# Patient Record
Sex: Female | Born: 1945 | Race: White | Hispanic: No | Smoking: Never smoker
Health system: Southern US, Community
[De-identification: ages and names within clinical notes are randomized; demographics above are authoritative.]

## PROBLEM LIST (undated history)

## (undated) HISTORY — PX: WISDOM TOOTH EXTRACTION: SHX21

## (undated) HISTORY — PX: FOOT SURGERY: SHX648

---

## 1997-12-07 ENCOUNTER — Other Ambulatory Visit: Admission: RE | Admit: 1997-12-07 | Discharge: 1997-12-07 | Payer: Self-pay | Admitting: Obstetrics and Gynecology

## 1998-12-13 ENCOUNTER — Other Ambulatory Visit: Admission: RE | Admit: 1998-12-13 | Discharge: 1998-12-13 | Payer: Self-pay | Admitting: Obstetrics and Gynecology

## 1999-08-17 ENCOUNTER — Encounter (INDEPENDENT_AMBULATORY_CARE_PROVIDER_SITE_OTHER): Payer: Self-pay | Admitting: Specialist

## 1999-08-17 ENCOUNTER — Other Ambulatory Visit: Admission: RE | Admit: 1999-08-17 | Discharge: 1999-08-17 | Payer: Self-pay | Admitting: Obstetrics and Gynecology

## 1999-12-26 ENCOUNTER — Other Ambulatory Visit: Admission: RE | Admit: 1999-12-26 | Discharge: 1999-12-26 | Payer: Self-pay | Admitting: Obstetrics and Gynecology

## 2001-02-25 ENCOUNTER — Other Ambulatory Visit: Admission: RE | Admit: 2001-02-25 | Discharge: 2001-02-25 | Payer: Self-pay | Admitting: Obstetrics and Gynecology

## 2004-03-10 ENCOUNTER — Other Ambulatory Visit: Admission: RE | Admit: 2004-03-10 | Discharge: 2004-03-10 | Payer: Self-pay | Admitting: Family Medicine

## 2004-03-31 ENCOUNTER — Ambulatory Visit (HOSPITAL_COMMUNITY): Admission: RE | Admit: 2004-03-31 | Discharge: 2004-03-31 | Payer: Self-pay | Admitting: Family Medicine

## 2004-04-17 ENCOUNTER — Encounter: Admission: RE | Admit: 2004-04-17 | Discharge: 2004-04-17 | Payer: Self-pay | Admitting: Family Medicine

## 2008-09-14 ENCOUNTER — Other Ambulatory Visit: Admission: RE | Admit: 2008-09-14 | Discharge: 2008-09-14 | Payer: Self-pay | Admitting: Family Medicine

## 2011-05-10 ENCOUNTER — Other Ambulatory Visit: Payer: Self-pay | Admitting: Family Medicine

## 2011-05-10 ENCOUNTER — Other Ambulatory Visit (HOSPITAL_COMMUNITY)
Admission: RE | Admit: 2011-05-10 | Discharge: 2011-05-10 | Disposition: A | Discharge status: Home / Self Care | Payer: Medicare Other | Source: Ambulatory Visit | Attending: Family Medicine | Admitting: Family Medicine

## 2011-05-10 DIAGNOSIS — Z124 Encounter for screening for malignant neoplasm of cervix: Secondary | ICD-10-CM | POA: Insufficient documentation

## 2011-12-03 ENCOUNTER — Other Ambulatory Visit: Payer: Self-pay | Admitting: Dermatology

## 2014-01-11 ENCOUNTER — Other Ambulatory Visit: Payer: Self-pay | Admitting: Family Medicine

## 2014-01-11 ENCOUNTER — Other Ambulatory Visit (HOSPITAL_COMMUNITY)
Admission: RE | Admit: 2014-01-11 | Discharge: 2014-01-11 | Disposition: A | Discharge status: Home / Self Care | Payer: Medicare HMO | Source: Ambulatory Visit | Attending: Family Medicine | Admitting: Family Medicine

## 2014-01-11 DIAGNOSIS — Z124 Encounter for screening for malignant neoplasm of cervix: Secondary | ICD-10-CM | POA: Diagnosis present

## 2014-01-13 LAB — CYTOLOGY - PAP

## 2016-07-27 DIAGNOSIS — Z6824 Body mass index (BMI) 24.0-24.9, adult: Secondary | ICD-10-CM | POA: Diagnosis not present

## 2016-07-27 DIAGNOSIS — Z0000 Encounter for general adult medical examination without abnormal findings: Secondary | ICD-10-CM | POA: Diagnosis not present

## 2016-07-27 DIAGNOSIS — M722 Plantar fascial fibromatosis: Secondary | ICD-10-CM | POA: Diagnosis not present

## 2017-01-24 DIAGNOSIS — H353111 Nonexudative age-related macular degeneration, right eye, early dry stage: Secondary | ICD-10-CM | POA: Diagnosis not present

## 2017-01-24 DIAGNOSIS — H2513 Age-related nuclear cataract, bilateral: Secondary | ICD-10-CM | POA: Diagnosis not present

## 2017-01-24 DIAGNOSIS — H04123 Dry eye syndrome of bilateral lacrimal glands: Secondary | ICD-10-CM | POA: Diagnosis not present

## 2017-01-24 DIAGNOSIS — H10503 Unspecified blepharoconjunctivitis, bilateral: Secondary | ICD-10-CM | POA: Diagnosis not present

## 2017-02-07 DIAGNOSIS — J019 Acute sinusitis, unspecified: Secondary | ICD-10-CM | POA: Diagnosis not present

## 2017-07-11 DIAGNOSIS — Z1389 Encounter for screening for other disorder: Secondary | ICD-10-CM | POA: Diagnosis not present

## 2017-07-11 DIAGNOSIS — Z0000 Encounter for general adult medical examination without abnormal findings: Secondary | ICD-10-CM | POA: Diagnosis not present

## 2017-07-11 DIAGNOSIS — Z1211 Encounter for screening for malignant neoplasm of colon: Secondary | ICD-10-CM | POA: Diagnosis not present

## 2017-07-11 DIAGNOSIS — Z23 Encounter for immunization: Secondary | ICD-10-CM | POA: Diagnosis not present

## 2017-07-11 DIAGNOSIS — E785 Hyperlipidemia, unspecified: Secondary | ICD-10-CM | POA: Diagnosis not present

## 2017-07-11 DIAGNOSIS — M810 Age-related osteoporosis without current pathological fracture: Secondary | ICD-10-CM | POA: Diagnosis not present

## 2017-07-15 DIAGNOSIS — R040 Epistaxis: Secondary | ICD-10-CM | POA: Diagnosis not present

## 2017-09-17 DIAGNOSIS — Z1211 Encounter for screening for malignant neoplasm of colon: Secondary | ICD-10-CM | POA: Diagnosis not present

## 2017-11-26 DIAGNOSIS — E785 Hyperlipidemia, unspecified: Secondary | ICD-10-CM | POA: Diagnosis not present

## 2018-04-15 DIAGNOSIS — I8311 Varicose veins of right lower extremity with inflammation: Secondary | ICD-10-CM | POA: Diagnosis not present

## 2018-04-15 DIAGNOSIS — I8312 Varicose veins of left lower extremity with inflammation: Secondary | ICD-10-CM | POA: Diagnosis not present

## 2018-05-28 DIAGNOSIS — H6983 Other specified disorders of Eustachian tube, bilateral: Secondary | ICD-10-CM | POA: Diagnosis not present

## 2018-05-28 DIAGNOSIS — J0140 Acute pansinusitis, unspecified: Secondary | ICD-10-CM | POA: Diagnosis not present

## 2018-05-28 DIAGNOSIS — R0982 Postnasal drip: Secondary | ICD-10-CM | POA: Diagnosis not present

## 2018-05-28 DIAGNOSIS — J029 Acute pharyngitis, unspecified: Secondary | ICD-10-CM | POA: Diagnosis not present

## 2018-06-10 DIAGNOSIS — G2581 Restless legs syndrome: Secondary | ICD-10-CM | POA: Diagnosis not present

## 2018-06-10 DIAGNOSIS — M79604 Pain in right leg: Secondary | ICD-10-CM | POA: Diagnosis not present

## 2018-06-10 DIAGNOSIS — M79605 Pain in left leg: Secondary | ICD-10-CM | POA: Diagnosis not present

## 2018-12-23 ENCOUNTER — Other Ambulatory Visit: Payer: Self-pay | Admitting: Family Medicine

## 2018-12-23 DIAGNOSIS — Z139 Encounter for screening, unspecified: Secondary | ICD-10-CM

## 2019-04-09 DIAGNOSIS — Z1231 Encounter for screening mammogram for malignant neoplasm of breast: Secondary | ICD-10-CM | POA: Diagnosis not present

## 2019-04-09 DIAGNOSIS — L821 Other seborrheic keratosis: Secondary | ICD-10-CM | POA: Diagnosis not present

## 2019-04-09 DIAGNOSIS — R238 Other skin changes: Secondary | ICD-10-CM | POA: Diagnosis not present

## 2019-05-19 DIAGNOSIS — Z1231 Encounter for screening mammogram for malignant neoplasm of breast: Secondary | ICD-10-CM | POA: Diagnosis not present

## 2019-12-23 DIAGNOSIS — R42 Dizziness and giddiness: Secondary | ICD-10-CM | POA: Diagnosis not present

## 2019-12-23 DIAGNOSIS — R079 Chest pain, unspecified: Secondary | ICD-10-CM | POA: Diagnosis not present

## 2019-12-23 DIAGNOSIS — R5383 Other fatigue: Secondary | ICD-10-CM | POA: Diagnosis not present

## 2019-12-23 DIAGNOSIS — I959 Hypotension, unspecified: Secondary | ICD-10-CM | POA: Diagnosis not present

## 2019-12-23 DIAGNOSIS — M810 Age-related osteoporosis without current pathological fracture: Secondary | ICD-10-CM | POA: Diagnosis not present

## 2019-12-24 ENCOUNTER — Other Ambulatory Visit: Payer: Self-pay | Admitting: Family Medicine

## 2019-12-24 ENCOUNTER — Ambulatory Visit
Admission: RE | Admit: 2019-12-24 | Discharge: 2019-12-24 | Disposition: A | Discharge status: Home / Self Care | Payer: PRIVATE HEALTH INSURANCE | Source: Ambulatory Visit | Attending: Family Medicine | Admitting: Family Medicine

## 2019-12-24 DIAGNOSIS — R079 Chest pain, unspecified: Secondary | ICD-10-CM | POA: Diagnosis not present

## 2019-12-24 DIAGNOSIS — R0602 Shortness of breath: Secondary | ICD-10-CM | POA: Diagnosis not present

## 2019-12-25 ENCOUNTER — Telehealth: Payer: Self-pay

## 2019-12-25 DIAGNOSIS — R059 Cough, unspecified: Secondary | ICD-10-CM | POA: Diagnosis not present

## 2019-12-25 DIAGNOSIS — Z03818 Encounter for observation for suspected exposure to other biological agents ruled out: Secondary | ICD-10-CM | POA: Diagnosis not present

## 2019-12-25 NOTE — Telephone Encounter (Signed)
NOTES ON FILE FROM [facility] [phone], SENT REFERRAL TO SCHEDULING 

## 2019-12-27 ENCOUNTER — Telehealth: Payer: Self-pay | Admitting: Infectious Diseases

## 2019-12-27 NOTE — Telephone Encounter (Signed)
Called to Discuss with patient about Covid symptoms and the use of the monoclonal antibody infusion for those with mild to moderate Covid symptoms and at a high risk of hospitalization.     Pt appears to qualify for this infusion due to co-morbid conditions and/or a member of an at-risk group in accordance with the FDA Emergency Use Authorization.    Sx onset with cough before Thanksgiving that she thought aspirated food.She also had some significant and out of character fatigue at this time that has not gotten any better. She also has had persistent cough and now some diarrhea that started last week.   Unfortunately she is day 10 today with no infusion appointments available. No symptoms to suggest ER visit but I will reach out to post covid clinic to help her with inperson assessment of fatigue and cough.   She is holding foods and fluids down well and not as dizzy now (her BP was very low at recent visit in the office 80s systolic). I also suggested to add some salt (bowl of soup a day, for example) to help with fluid retention as well.   Will help her with scheduling    Rexene Alberts, MSN, NP-C [facility]  [email or web address] Pager: [phone] Office: [phone] [facility] Main Line: [phone]

## 2019-12-28 ENCOUNTER — Other Ambulatory Visit (HOSPITAL_COMMUNITY): Payer: Self-pay

## 2019-12-28 ENCOUNTER — Telehealth (INDEPENDENT_AMBULATORY_CARE_PROVIDER_SITE_OTHER): Payer: Medicare HMO | Admitting: Nurse Practitioner

## 2019-12-28 DIAGNOSIS — R059 Cough, unspecified: Secondary | ICD-10-CM | POA: Diagnosis not present

## 2019-12-28 DIAGNOSIS — U071 COVID-19: Secondary | ICD-10-CM | POA: Insufficient documentation

## 2019-12-28 MED ORDER — PREDNISONE 20 MG PO TABS: 20.0000 mg | ORAL_TABLET | Freq: Every day | ORAL | 0 refills | Status: AC

## 2019-12-28 NOTE — Patient Instructions (Signed)
Covid 19 Cough:   Stay well hydrated  Eat six small meals per day  Stay active  Deep breathing exercises  May take tylenol for fever or pain  May take delsym twice daily  Will order prednisone   Follow up:  Follow up if needed

## 2019-12-28 NOTE — Telephone Encounter (Signed)
Destiny Green,  Could you please call this patient to schedule an appointment? Thanks.

## 2019-12-28 NOTE — Progress Notes (Signed)
Virtual Visit via Telephone Note  I connected with Tahiry Payano on 12/28/19 at  2:00 PM EST by telephone and verified that I am speaking with the correct person using two identifiers.  Location:  Patient: home Provider: office   I discussed the limitations, risks, security and privacy concerns of performing an evaluation and management service by telephone and the availability of in person appointments. I also discussed with the patient that there may be a patient responsible charge related to this service. The patient expressed understanding and agreed to proceed.   History of Present Illness:  74 year old female with no significant health history.  Patient was recently diagnosed with Covid but states that her symptoms started before Thanksgiving.  She was out of the window to receive monoclonal antibody infusion.  Patient was scheduled today for an in office visit but states that she was too weak to come to the office and this visit was changed to a televisit.  Patient complains of ongoing weakness, fatigue, and cough.  Patient states that she has had a poor appetite.  She is trying to stay hydrated.  She also complains that her blood pressure has been lower than normal.  We discussed the importance of eating 6 small meals throughout the day and staying well-hydrated.  We also discussed the importance of deep breathing exercise and staying active.  Patient denies any recent fever or significant shortness of breath.  She did have a recent chest x-ray which did not show any pneumonia.  The chest x-ray was clear.  Patient states that she has not taken any over-the-counter medications to try to help the cough.  Denies f/c/s, n/v/d, hemoptysis, PND, chest pain or edema.      Observations/Objective:  Patient is alert and oriented during tele-visit today   Assessment and Plan:  Covid 19 Cough:   Stay well hydrated  Eat six small meals per day  Stay active  Deep breathing  exercises  May take tylenol for fever or pain  May take delsym twice daily  Will order prednisone   Follow Up Instructions:  Follow up if needed     I discussed the assessment and treatment plan with the patient. The patient was provided an opportunity to ask questions and all were answered. The patient agreed with the plan and demonstrated an understanding of the instructions.   The patient was advised to call back or seek an in-person evaluation if the symptoms worsen or if the condition fails to improve as anticipated.  I provided 22 minutes of non-face-to-face time during this encounter.   Ivonne Andrew, NP

## 2020-02-03 ENCOUNTER — Encounter: Payer: Self-pay | Admitting: General Practice

## 2020-08-09 DIAGNOSIS — Z7722 Contact with and (suspected) exposure to environmental tobacco smoke (acute) (chronic): Secondary | ICD-10-CM | POA: Diagnosis not present

## 2020-08-09 DIAGNOSIS — H3530 Unspecified macular degeneration: Secondary | ICD-10-CM | POA: Diagnosis not present

## 2020-08-09 DIAGNOSIS — R030 Elevated blood-pressure reading, without diagnosis of hypertension: Secondary | ICD-10-CM | POA: Diagnosis not present

## 2020-08-09 DIAGNOSIS — E785 Hyperlipidemia, unspecified: Secondary | ICD-10-CM | POA: Diagnosis not present

## 2020-09-08 DIAGNOSIS — H0102A Squamous blepharitis right eye, upper and lower eyelids: Secondary | ICD-10-CM | POA: Diagnosis not present

## 2020-09-08 DIAGNOSIS — H04123 Dry eye syndrome of bilateral lacrimal glands: Secondary | ICD-10-CM | POA: Diagnosis not present

## 2020-09-08 DIAGNOSIS — H353111 Nonexudative age-related macular degeneration, right eye, early dry stage: Secondary | ICD-10-CM | POA: Diagnosis not present

## 2020-09-08 DIAGNOSIS — H2513 Age-related nuclear cataract, bilateral: Secondary | ICD-10-CM | POA: Diagnosis not present

## 2020-09-30 ENCOUNTER — Ambulatory Visit: Payer: Medicare HMO | Admitting: Family Medicine

## 2020-11-30 ENCOUNTER — Other Ambulatory Visit: Payer: Self-pay

## 2020-12-01 ENCOUNTER — Ambulatory Visit (INDEPENDENT_AMBULATORY_CARE_PROVIDER_SITE_OTHER): Payer: Medicare HMO | Admitting: Family Medicine

## 2020-12-01 ENCOUNTER — Encounter: Payer: Self-pay | Admitting: Family Medicine

## 2020-12-01 VITALS — BP 116/72 | HR 59 | Temp 97.8°F | Ht 63.0 in | Wt 148.8 lb

## 2020-12-01 DIAGNOSIS — S86111A Strain of other muscle(s) and tendon(s) of posterior muscle group at lower leg level, right leg, initial encounter: Secondary | ICD-10-CM

## 2020-12-01 DIAGNOSIS — Z0000 Encounter for general adult medical examination without abnormal findings: Secondary | ICD-10-CM | POA: Diagnosis not present

## 2020-12-01 NOTE — Progress Notes (Signed)
New Patient Office Visit  Subjective:  Patient ID: Destiny Green, female    DOB: 1945-01-26  Age: 75 y.o. MRN: [id number]  CC:  Chief Complaint  Patient presents with   Establish Care    NP/establish care concerns about pain in right leg come and go.     HPI Destiny Green presents for for health check and evaluation of pain in her right medial leg.  It started 7 months ago after she was [place] and had to walk up and down stairs.  If anything it is gotten a little worse.  There was no specific injury it is just been sore since that time.  She would like to transfer her care from Dr. Cliffton Asters.  It has been over 15 years since she had a colonoscopy.  He has no symptoms referable to the colon or intestinal tract.  She does not believe in vaccines.  She did have a mammogram last year and does not intend on having another.  It has been over 15 years since she had a colonoscopy.  History reviewed. No pertinent past medical history.    Family History  Adopted: Yes  Problem Relation Age of Onset   Heart disease Mother     Social History   Socioeconomic History   Marital status: Widowed    Spouse name: Not on file   Number of children: Not on file   Years of education: Not on file   Highest education level: Not on file  Occupational History   Not on file  Tobacco Use   Smoking status: Never   Smokeless tobacco: Never  Vaping Use   Vaping Use: Never used  Substance and Sexual Activity   Alcohol use: Never   Drug use: Never   Sexual activity: Not Currently  Other Topics Concern   Not on file  Social History Narrative   Not on file   Social Determinants of Health   Financial Resource Strain: Not on file  Food Insecurity: Not on file  Transportation Needs: Not on file  Physical Activity: Not on file  Stress: Not on file  Social Connections: Not on file  Intimate Partner Violence: Not on file    ROS Review of Systems  Constitutional:  Negative for  chills, diaphoresis, fatigue, fever and unexpected weight change.  HENT: Negative.    Eyes:  Negative for photophobia and visual disturbance.  Respiratory: Negative.    Cardiovascular: Negative.   Gastrointestinal: Negative.  Negative for abdominal pain, anal bleeding and blood in stool.  Endocrine: Negative for polyphagia and polyuria.  Genitourinary:  Negative for difficulty urinating and hematuria.  Musculoskeletal:  Positive for gait problem and myalgias.  Neurological:  Negative for facial asymmetry, speech difficulty and weakness.  Depression screen [facility] 2/9 12/01/2020 12/01/2020 12/28/2019  Decreased Interest 0 0 1  Down, Depressed, Hopeless 0 0 1  PHQ - 2 Score 0 0 2  Altered sleeping 2 - 2  Tired, decreased energy 2 - 3  Change in appetite 3 - 3  Feeling bad or failure about yourself  0 - 0  Trouble concentrating 0 - 0  Moving slowly or fidgety/restless 0 - 0  Suicidal thoughts 0 - 0  PHQ-9 Score 7 - 10  Difficult doing work/chores Not difficult at all - -     Objective:   Today's Vitals: BP 116/72 (BP Location: Right Arm, Patient Position: Sitting, Cuff Size: Normal)   Pulse (!) 59   Temp 97.8 F (  36.6 C) (Temporal)   Ht 5\' 3"  (1.6 m)   Wt 148 lb 12.8 oz (67.5 kg)   SpO2 99%   BMI 26.36 kg/m   Physical Exam Vitals and nursing note reviewed.  Constitutional:      General: She is not in acute distress.    Appearance: Normal appearance. She is not ill-appearing, toxic-appearing or diaphoretic.  HENT:     Head: Normocephalic and atraumatic.     Right Ear: Tympanic membrane, ear canal and external ear normal.     Left Ear: Tympanic membrane, ear canal and external ear normal.     Mouth/Throat:     Mouth: Mucous membranes are moist.     Pharynx: Oropharynx is clear. No oropharyngeal exudate or posterior oropharyngeal erythema.  Eyes:     General: No scleral icterus.       Right eye: No discharge.        Left eye: No discharge.     Extraocular Movements:  Extraocular movements intact.     Conjunctiva/sclera: Conjunctivae normal.     Pupils: Pupils are equal, round, and reactive to light.  Neck:     Vascular: No carotid bruit.  Cardiovascular:     Rate and Rhythm: Normal rate and regular rhythm.  Pulmonary:     Effort: Pulmonary effort is normal.     Breath sounds: Normal breath sounds.  Abdominal:     General: Bowel sounds are normal.  Musculoskeletal:     Cervical back: No rigidity or tenderness.     Right lower leg: No edema.     Left lower leg: No swelling, deformity, lacerations, tenderness or bony tenderness. No edema.       Legs:  Lymphadenopathy:     Cervical: No cervical adenopathy.  Skin:    General: Skin is warm and dry.  Neurological:     Mental Status: She is alert and oriented to person, place, and time.     Cranial Nerves: No dysarthria or facial asymmetry.     Motor: No weakness or atrophy.  Psychiatric:        Mood and Affect: Mood normal.        Behavior: Behavior normal.    Assessment & Plan:   Problem List Items Addressed This Visit   None Visit Diagnoses     Healthcare maintenance    -  Primary   Relevant Orders   CBC   Comprehensive metabolic panel   LDL cholesterol, direct   Lipid panel   Urinalysis, Routine w reflex microscopic   DG Bone Density   Ambulatory referral to Gastroenterology   Strain of right gastrocnemius muscle, initial encounter       Relevant Orders   Ambulatory referral to Sports Medicine       No outpatient encounter medications on file as of 12/01/2020.   No facility-administered encounter medications on file as of 12/01/2020.    Follow-up: Return in about 6 months (around 05/31/2021), or if symptoms worsen or fail to improve.  Information was given on health maintenance and disease prevention as well as immunizations recommended for those over 65.  Patient informed me that she may not go for her colonoscopy.  She is refusing vaccinations because she does not believe  in them.  Over 30 minutes was spent with this patient between the interview, her exam and reviewing the record. Libby Maw, MD

## 2020-12-02 ENCOUNTER — Telehealth (HOSPITAL_BASED_OUTPATIENT_CLINIC_OR_DEPARTMENT_OTHER): Payer: Self-pay

## 2020-12-02 LAB — COMPREHENSIVE METABOLIC PANEL
ALT: 22 U/L (ref 0–35)
AST: 23 U/L (ref 0–37)
Albumin: 4.5 g/dL (ref 3.5–5.2)
Alkaline Phosphatase: 89 U/L (ref 39–117)
BUN: 18 mg/dL (ref 6–23)
CO2: 26 mEq/L (ref 19–32)
Calcium: 9.5 mg/dL (ref 8.4–10.5)
Chloride: 106 mEq/L (ref 96–112)
Creatinine, Ser: 0.83 mg/dL (ref 0.40–1.20)
GFR: 68.7 mL/min (ref >60.00)
Glucose, Bld: 99 mg/dL (ref 70–99)
Potassium: 4 mEq/L (ref 3.5–5.1)
Sodium: 142 mEq/L (ref 135–145)
Total Bilirubin: 0.5 mg/dL (ref 0.2–1.2)
Total Protein: 6.7 g/dL (ref 6.0–8.3)

## 2020-12-02 LAB — URINALYSIS, ROUTINE W REFLEX MICROSCOPIC
Bilirubin Urine: NEGATIVE
Ketones, ur: NEGATIVE
Nitrite: NEGATIVE
Specific Gravity, Urine: 1.025 (ref 1.000–1.030)
Total Protein, Urine: NEGATIVE
Urine Glucose: NEGATIVE
Urobilinogen, UA: 0.2 (ref 0.0–1.0)
pH: 6 (ref 5.0–8.0)

## 2020-12-02 LAB — LIPID PANEL
Cholesterol: 248 mg/dL — ABNORMAL HIGH (ref 0–200)
HDL: 46.2 mg/dL (ref >39.00)
NonHDL: 202.16
Total CHOL/HDL Ratio: 5
Triglycerides: 208 mg/dL — ABNORMAL HIGH (ref 0.0–149.0)
VLDL: 41.6 mg/dL — ABNORMAL HIGH (ref 0.0–40.0)

## 2020-12-02 LAB — CBC
HCT: 40.5 % (ref 36.0–46.0)
Hemoglobin: 13.5 g/dL (ref 12.0–15.0)
MCHC: 33.3 g/dL (ref 30.0–36.0)
MCV: 98.1 fl (ref 78.0–100.0)
Platelets: 238 10*3/uL (ref 150.0–400.0)
RBC: 4.13 Mil/uL (ref 3.87–5.11)
RDW: 12.8 % (ref 11.5–15.5)
WBC: 6.4 10*3/uL (ref 4.0–10.5)

## 2020-12-02 LAB — LDL CHOLESTEROL, DIRECT: Direct LDL: 147 mg/dL

## 2020-12-07 ENCOUNTER — Ambulatory Visit: Payer: Medicare HMO | Admitting: Family Medicine

## 2020-12-07 ENCOUNTER — Ambulatory Visit: Payer: Self-pay

## 2020-12-07 ENCOUNTER — Encounter: Payer: Self-pay | Admitting: Family Medicine

## 2020-12-07 VITALS — BP 138/76 | Ht 63.75 in | Wt 146.0 lb

## 2020-12-07 DIAGNOSIS — M7660 Achilles tendinitis, unspecified leg: Secondary | ICD-10-CM

## 2020-12-07 DIAGNOSIS — M25871 Other specified joint disorders, right ankle and foot: Secondary | ICD-10-CM

## 2020-12-07 MED ORDER — PREDNISONE 5 MG PO TABS: ORAL_TABLET | ORAL | 0 refills | Status: DC

## 2020-12-07 NOTE — Progress Notes (Signed)
  Destiny Green - 75 y.o. female MRN [id number]  Date of birth: May 10, 1945  SUBJECTIVE:  Including CC & ROS.  No chief complaint on file.   Destiny Green is a 75 y.o. female that is presenting with right posterior ankle pain.  Pain has been ongoing for about 7 months.  It is worse with bending down in certain positions.  Seems to be localized to the Achilles area.  She remembers going up and down the stairs that initiated the event.  Has not tried anything for it at this time.    Review of Systems See HPI   HISTORY: Past Medical, Surgical, Social, and Family History Reviewed & Updated per EMR.   Pertinent Historical Findings include:  History reviewed. No pertinent past medical history.  Past Surgical History:  Procedure Laterality Date   FOOT SURGERY     WISDOM TOOTH EXTRACTION      Family History  Adopted: Yes  Problem Relation Age of Onset   Heart disease Mother     Social History   Socioeconomic History   Marital status: Widowed    Spouse name: Not on file   Number of children: Not on file   Years of education: Not on file   Highest education level: Not on file  Occupational History   Not on file  Tobacco Use   Smoking status: Never   Smokeless tobacco: Never  Vaping Use   Vaping Use: Never used  Substance and Sexual Activity   Alcohol use: Never   Drug use: Never   Sexual activity: Not Currently  Other Topics Concern   Not on file  Social History Narrative   Not on file   Social Determinants of Health   Financial Resource Strain: Not on file  Food Insecurity: Not on file  Transportation Needs: Not on file  Physical Activity: Not on file  Stress: Not on file  Social Connections: Not on file  Intimate Partner Violence: Not on file     PHYSICAL EXAM:  VS: BP 138/76 (BP Location: Left Arm, Patient Position: Sitting)   Ht 5' 3.75" (1.619 m)   Wt 146 lb (66.2 kg)   BMI 25.26 kg/m  Physical Exam Gen: NAD, alert, cooperative with exam,  well-appearing   Limited ultrasound: Right lower leg:  Normal-appearing Achilles tendon at the mid belly and insertion. Hypoechoic change in the posterior fat pad which could represent impingement. Normal-appearing musculotendinous junction. No retrocalcaneal bursitis  Summary: Findings consistent with posterior impingement  Ultrasound and interpretation by Clare Gandy, MD     ASSESSMENT & PLAN:   Ankle impingement syndrome, right Acutely worsening.  Pain is been ongoing for several months.  Seems more consistent with impingement as opposed to Achilles.  Possible for radicular pain as well. -Counseled on home exercise therapy and supportive care. -Prednisone. -Heel lift. -Could consider injection or physical therapy.

## 2020-12-07 NOTE — Patient Instructions (Signed)
Nice to meet you Please try the medicine  Please consider compression   Please try the exercises  Please try the heel lifts  Please send me a message in MyChart with any questions or updates.  Please see me back in 4 weeks.   --Dr. Jordan Likes

## 2020-12-07 NOTE — Assessment & Plan Note (Signed)
Acutely worsening.  Pain is been ongoing for several months.  Seems more consistent with impingement as opposed to Achilles.  Possible for radicular pain as well. -Counseled on home exercise therapy and supportive care. -Prednisone. -Heel lift. -Could consider injection or physical therapy.

## 2020-12-08 ENCOUNTER — Other Ambulatory Visit: Payer: Self-pay

## 2020-12-08 ENCOUNTER — Other Ambulatory Visit (HOSPITAL_BASED_OUTPATIENT_CLINIC_OR_DEPARTMENT_OTHER): Payer: Self-pay | Admitting: Family Medicine

## 2020-12-08 ENCOUNTER — Ambulatory Visit (HOSPITAL_BASED_OUTPATIENT_CLINIC_OR_DEPARTMENT_OTHER)
Admission: RE | Admit: 2020-12-08 | Discharge: 2020-12-08 | Disposition: A | Discharge status: Home / Self Care | Payer: Medicare HMO | Source: Ambulatory Visit | Attending: Family Medicine | Admitting: Family Medicine

## 2020-12-08 DIAGNOSIS — Z0000 Encounter for general adult medical examination without abnormal findings: Secondary | ICD-10-CM

## 2020-12-08 DIAGNOSIS — Z780 Asymptomatic menopausal state: Secondary | ICD-10-CM | POA: Insufficient documentation

## 2020-12-08 DIAGNOSIS — Z13820 Encounter for screening for osteoporosis: Secondary | ICD-10-CM | POA: Diagnosis not present

## 2020-12-08 DIAGNOSIS — M810 Age-related osteoporosis without current pathological fracture: Secondary | ICD-10-CM | POA: Diagnosis not present

## 2020-12-08 DIAGNOSIS — M8588 Other specified disorders of bone density and structure, other site: Secondary | ICD-10-CM | POA: Diagnosis not present

## 2021-01-16 IMAGING — DX DG CHEST 2V
2 series · 2 of 2 positions shown · non-contrast
Comparison: None.

CLINICAL DATA: Chest pain and shortness of breath.

EXAM:
CHEST - 2 VIEW

[dg chest 2 view (1 of 2)]
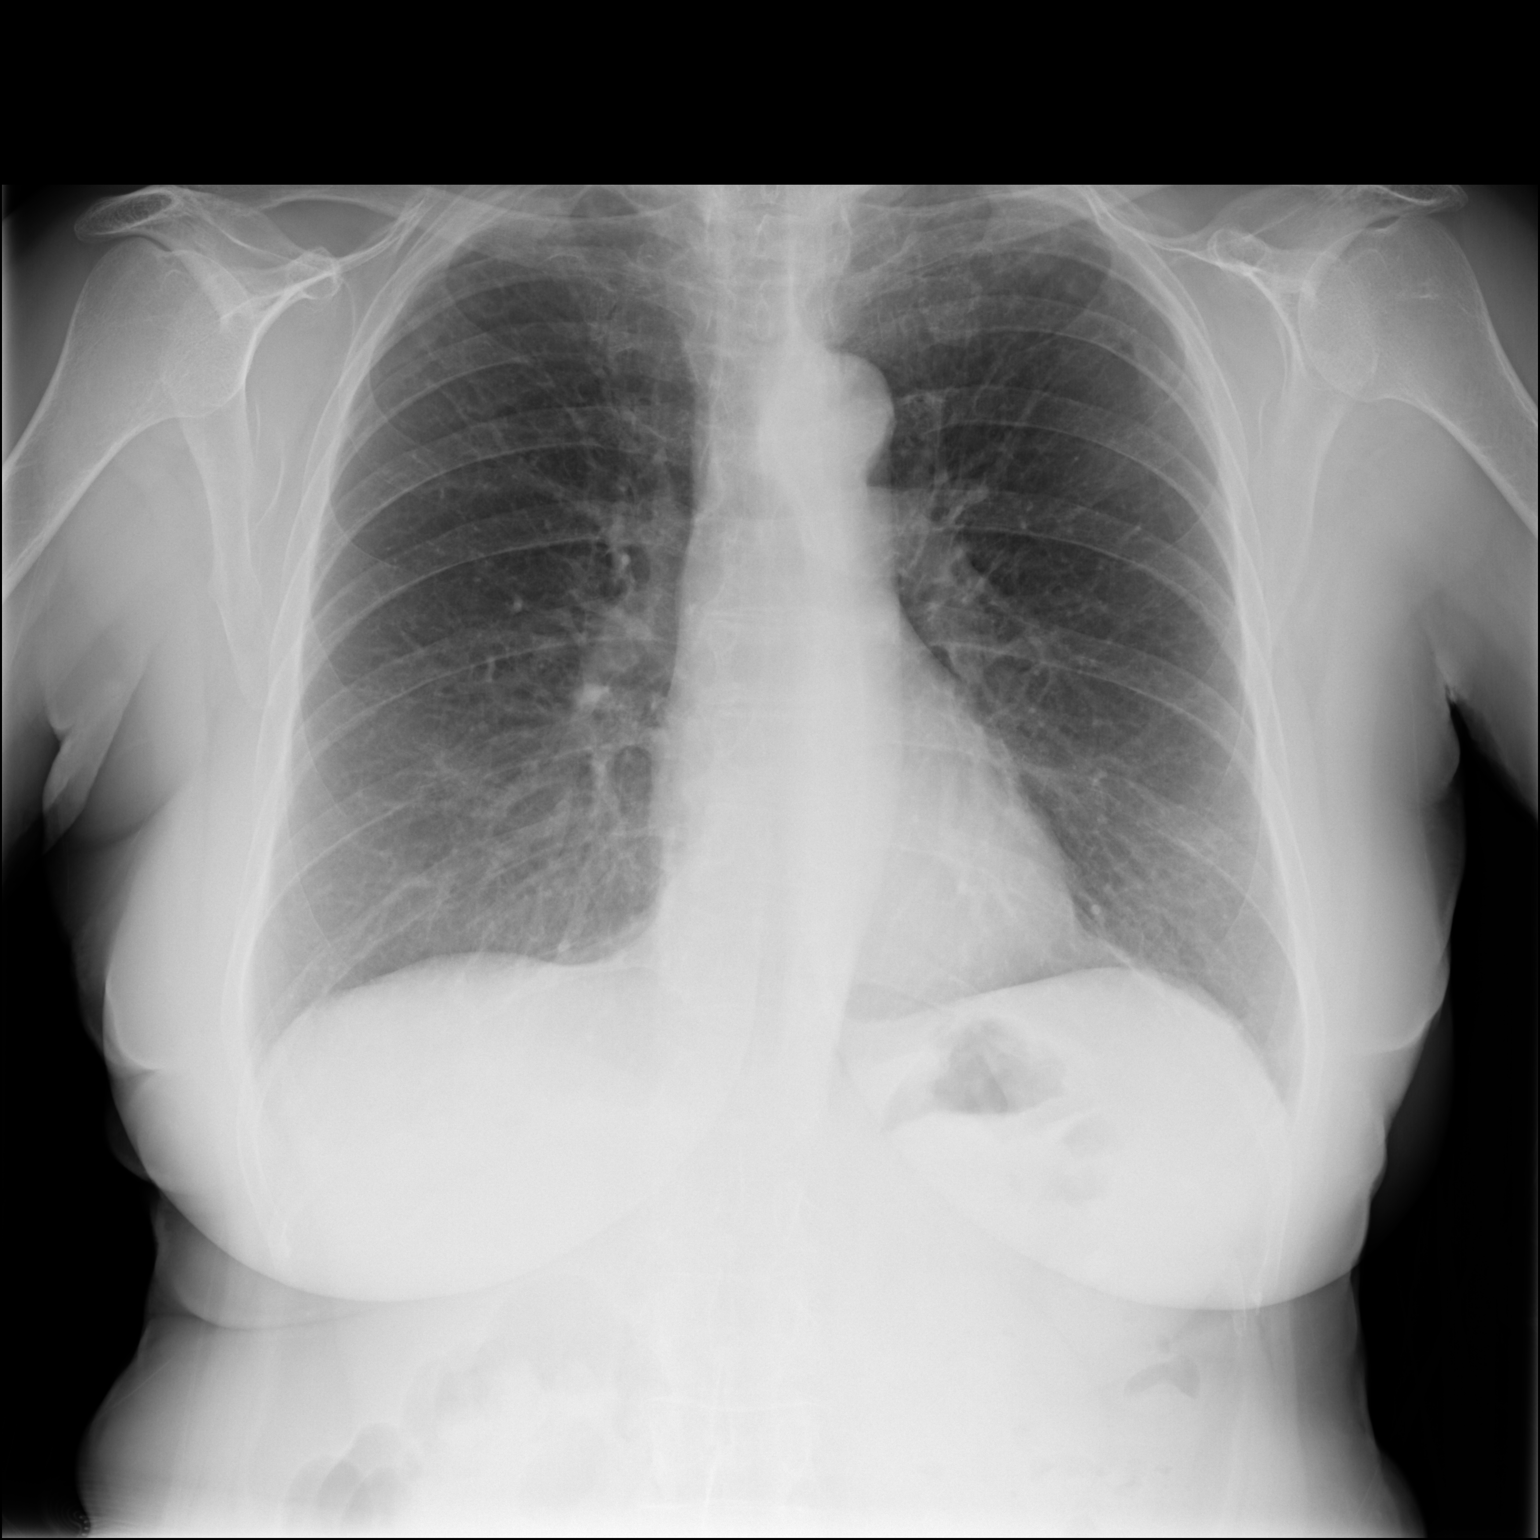

[dg chest 2 view (2 of 2)]
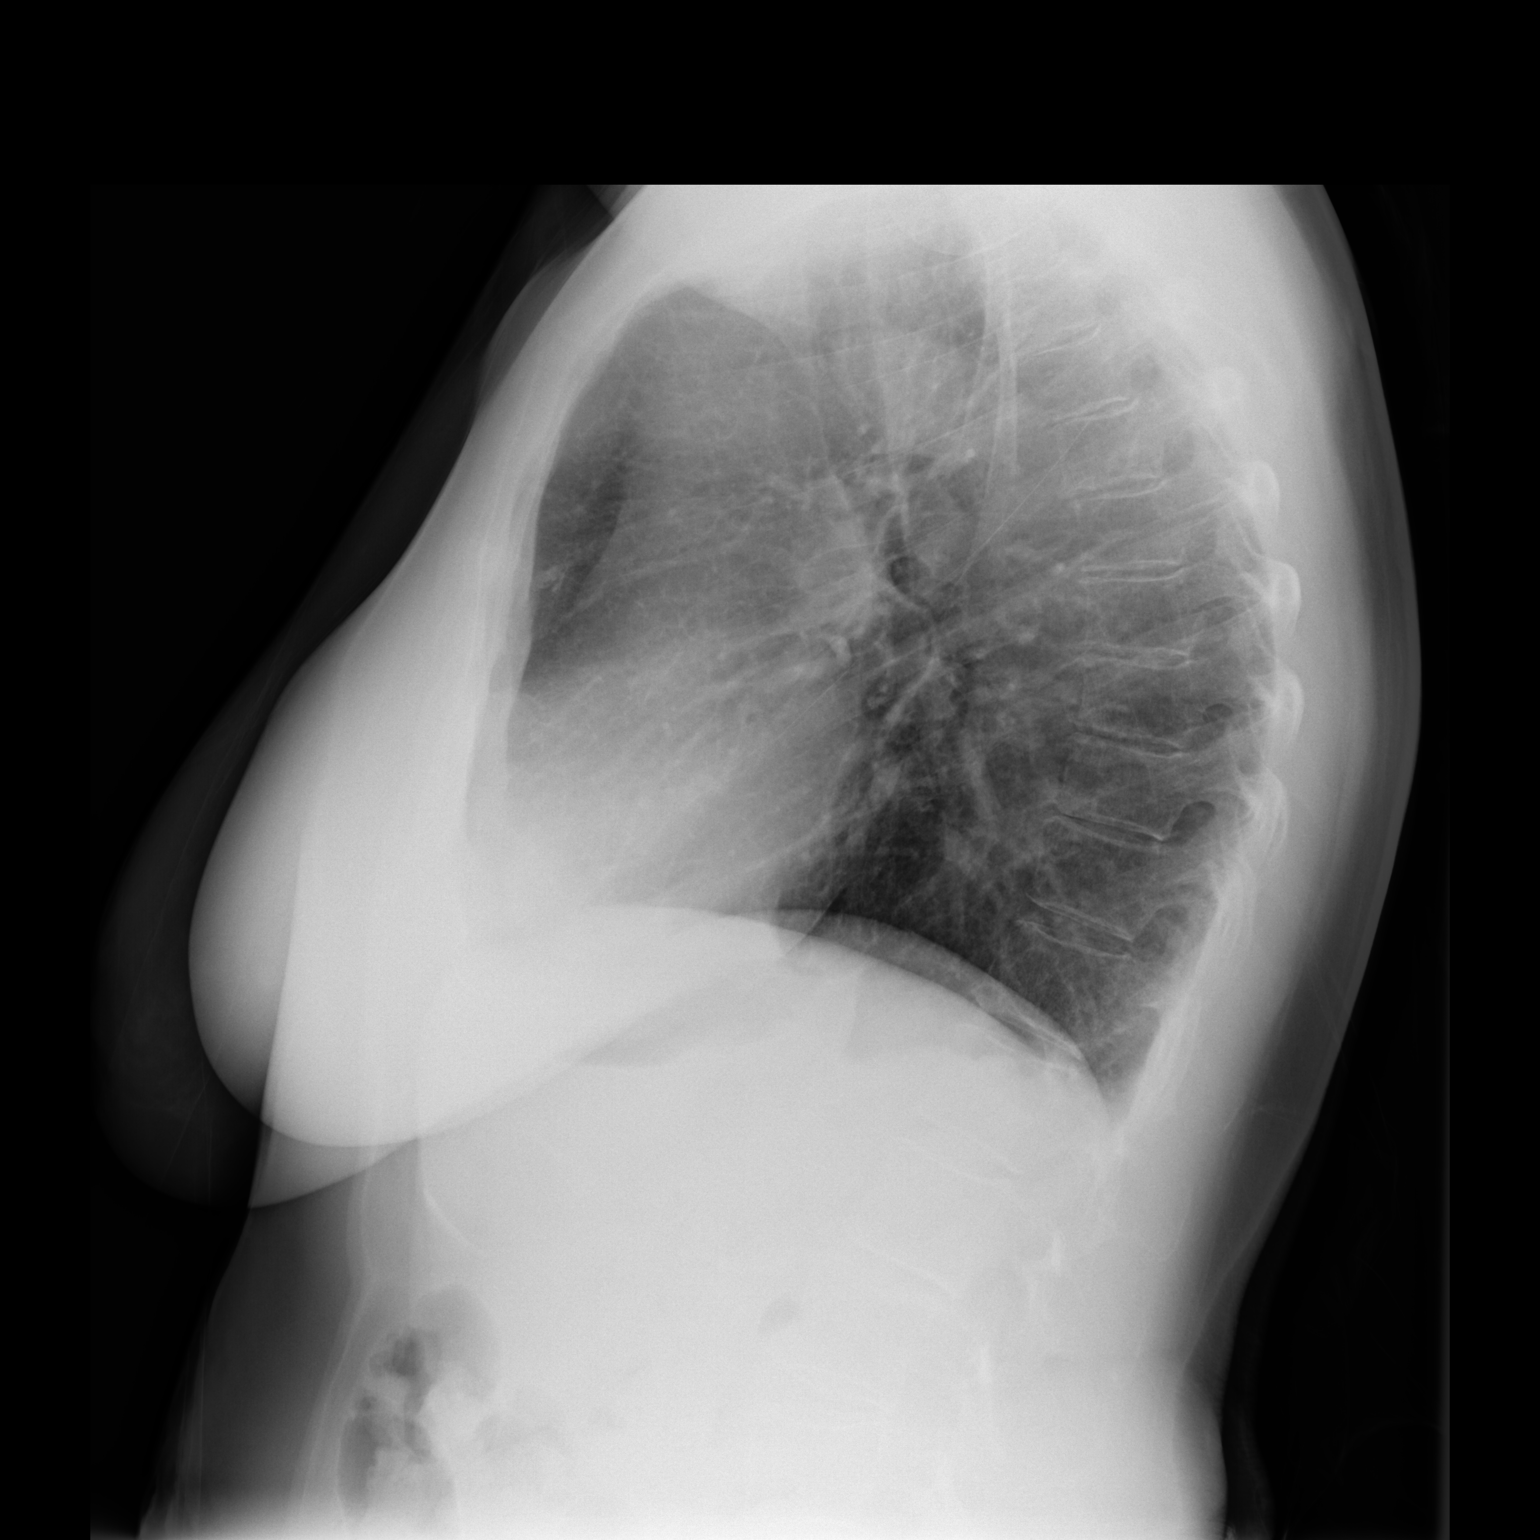

[2 of 2 positions shown; findings below may reference images not displayed]

FINDINGS: Lungs are adequately inflated without focal airspace consolidation
or effusion. Cardiomediastinal silhouette is normal. Remaining bones
and soft tissues are unremarkable.
IMPRESSION: No active cardiopulmonary disease.

## 2021-01-30 ENCOUNTER — Other Ambulatory Visit: Payer: Self-pay

## 2021-01-30 ENCOUNTER — Ambulatory Visit (INDEPENDENT_AMBULATORY_CARE_PROVIDER_SITE_OTHER): Payer: Medicare HMO | Admitting: Family Medicine

## 2021-01-30 ENCOUNTER — Ambulatory Visit: Payer: Medicare HMO | Admitting: Family Medicine

## 2021-01-30 ENCOUNTER — Encounter: Payer: Self-pay | Admitting: Family Medicine

## 2021-01-30 VITALS — BP 122/68 | HR 68 | Temp 97.5°F | Ht 63.0 in | Wt 150.6 lb

## 2021-01-30 DIAGNOSIS — R8290 Unspecified abnormal findings in urine: Secondary | ICD-10-CM | POA: Diagnosis not present

## 2021-01-30 DIAGNOSIS — E860 Dehydration: Secondary | ICD-10-CM | POA: Diagnosis not present

## 2021-01-30 DIAGNOSIS — Z9189 Other specified personal risk factors, not elsewhere classified: Secondary | ICD-10-CM | POA: Diagnosis not present

## 2021-01-30 DIAGNOSIS — M810 Age-related osteoporosis without current pathological fracture: Secondary | ICD-10-CM | POA: Diagnosis not present

## 2021-01-30 DIAGNOSIS — E7800 Pure hypercholesterolemia, unspecified: Secondary | ICD-10-CM | POA: Diagnosis not present

## 2021-01-30 NOTE — Progress Notes (Signed)
Established Patient Office Visit  Subjective:  Patient ID: Destiny Green, female    DOB: Mar 15, 1945  Age: 76 y.o. MRN: [id number]  CC:  Chief Complaint  Patient presents with   Advice Only    Discuss labs and bone density results.     HPI Destiny Green presents for discussion of her recent laboratory results with an elevated ASCVD risk score and bone density amatory testing that showed osteoporosis.  She is currently taking calcium and vitamin D but is not certain as to the amount.  At this time she is not interested in treating her osteoporosis with anything else.  She understands that she is at increased risk for an osteoporotic fracture.  No past medical history on file.  Past Surgical History:  Procedure Laterality Date   FOOT SURGERY     WISDOM TOOTH EXTRACTION      Family History  Adopted: Yes  Problem Relation Age of Onset   Heart disease Mother     Social History   Socioeconomic History   Marital status: Widowed    Spouse name: Not on file   Number of children: Not on file   Years of education: Not on file   Highest education level: Not on file  Occupational History   Not on file  Tobacco Use   Smoking status: Never   Smokeless tobacco: Never  Vaping Use   Vaping Use: Never used  Substance and Sexual Activity   Alcohol use: Never   Drug use: Never   Sexual activity: Not Currently  Other Topics Concern   Not on file  Social History Narrative   Not on file   Social Determinants of Health   Financial Resource Strain: Not on file  Food Insecurity: Not on file  Transportation Needs: Not on file  Physical Activity: Not on file  Stress: Not on file  Social Connections: Not on file  Intimate Partner Violence: Not on file    Outpatient Medications Prior to Visit  Medication Sig Dispense Refill   Alpha Lipoic Acid 200 MG CAPS 100 MG- 1 tablet     CHELATED ZINC PO 15mg  w/copper 2mg      Cholecalciferol (VITAMIN D3) 50 MCG (2000 UT) capsule 1 capsule      Coenzyme Q10-Vitamin E (COQ10-VITAMIN E PO) 100mg -20mg      GLUCOSAMINE CHONDROITIN COMPLX PO See admin instructions.     Magnesium 400 MG TABS 1 tablet     Oyster Shell Calcium 500 MG TABS 1 tablet with meals     Quercetin 50 MG TABS 500 MG- 1 tablet     Turmeric 500 MG TABS 1/2 tablet     predniSONE (DELTASONE) 5 MG tablet Take 6 pills for first day, 5 pills second day, 4 pills third day, 3 pills fourth day, 2 pills the fifth day, and 1 pill sixth day. 21 tablet 0   No facility-administered medications prior to visit.    Allergies  Allergen Reactions   Penicillin G     Other reaction(s): Other (See Comments) Pt states it causes hives.    ROS Review of Systems  Constitutional: Negative.   Respiratory: Negative.    Cardiovascular: Negative.   Gastrointestinal: Negative.   Genitourinary: Negative.      Objective:    Physical Exam Vitals and nursing note reviewed.  Constitutional:      Appearance: Normal appearance.  HENT:     Head: Normocephalic and atraumatic.  Eyes:     General:  Right eye: No discharge.        Left eye: No discharge.     Extraocular Movements: Extraocular movements intact.     Conjunctiva/sclera: Conjunctivae normal.  Pulmonary:     Effort: Pulmonary effort is normal.  Neurological:     Mental Status: She is alert and oriented to person, place, and time.  Psychiatric:        Mood and Affect: Mood normal.        Behavior: Behavior normal.    BP 122/68 (BP Location: Right Arm, Patient Position: Sitting, Cuff Size: Normal)    Pulse 68    Temp (!) 97.5 F (36.4 C) (Temporal)    Ht 5\' 3"  (1.6 m)    Wt 150 lb 9.6 oz (68.3 kg)    SpO2 99%    BMI 26.68 kg/m  Wt Readings from Last 3 Encounters:  01/30/21 150 lb 9.6 oz (68.3 kg)  12/07/20 146 lb (66.2 kg)  12/01/20 148 lb 12.8 oz (67.5 kg)     Health Maintenance Due  Topic Date Due   Hepatitis C Screening  Never done   Zoster Vaccines- Shingrix (1 of 2) Never done    There are no  preventive care reminders to display for this patient.  No results found for: TSH Lab Results  Component Value Date   WBC 6.4 12/01/2020   HGB 13.5 12/01/2020   HCT 40.5 12/01/2020   MCV 98.1 12/01/2020   PLT 238.0 12/01/2020   Lab Results  Component Value Date   NA 142 12/01/2020   K 4.0 12/01/2020   CO2 26 12/01/2020   GLUCOSE 99 12/01/2020   BUN 18 12/01/2020   CREATININE 0.83 12/01/2020   BILITOT 0.5 12/01/2020   ALKPHOS 89 12/01/2020   AST 23 12/01/2020   ALT 22 12/01/2020   PROT 6.7 12/01/2020   ALBUMIN 4.5 12/01/2020   CALCIUM 9.5 12/01/2020   GFR 68.70 12/01/2020   Lab Results  Component Value Date   CHOL 248 (H) 12/01/2020   Lab Results  Component Value Date   HDL 46.20 12/01/2020   [facility] results found for: [facility] Results  Component Value Date   TRIG 208.0 (H) 12/01/2020   Lab Results  Component Value Date   CHOLHDL 5 12/01/2020   No results found for: HGBA1C   The 10-year ASCVD risk score (Arnett DK, et al., 2019) is: 16.4%   Values used to calculate the score:     Age: [age] years     Sex: Female     Is Non-Hispanic African American: No     Diabetic: No     Tobacco smoker: No     Systolic Blood Pressure: 122 mmHg     Is BP treated: No     HDL Cholesterol: 46.2 mg/dL     Total Cholesterol: 248 mg/dL  Assessment & Plan:   Problem List Items Addressed This Visit       Musculoskeletal and Integument   Age-related osteoporosis without current pathological fracture   Relevant Medications   Cholecalciferol (VITAMIN D3) 50 MCG (2000 UT) capsule   Oyster Shell Calcium 500 MG TABS     Other   Abnormal urine   Relevant Orders   Urinalysis, Routine w reflex microscopic   Dehydration   At risk for cardiovascular event - Primary   Elevated LDL cholesterol level    No orders of the defined types were placed in this encounter.   Follow-up: Return in about 6 months (around 07/30/2021).  We discussed treating her osteoporosis with a  bisphosphonate, 1200 mg of calcium and 800 international units of vitamin D daily.  Patient declines bisphosphonate at this time.  She will check with her dentist.  Information on preventing osteoporosis, weekly alendronate for osteoporosis.  She understands that exercise will also help.  She says that she will comply with recommended calcium and vitamin D intake.  Patient has an elevated 10-year risk score for vascular disease.  I offered her statin and she refused.  She was given information on preventing high cholesterol with her diet.  Patient was dehydrated most probably due to her fasting status for labs that were drawn.  Believe that this is most likely the cause of her grossly abnormal UA.  She will return for a repeat UA and be well-hydrated.  Mliss Sax, MD

## 2021-02-16 ENCOUNTER — Telehealth: Payer: Self-pay | Admitting: Family Medicine

## 2021-02-16 NOTE — Telephone Encounter (Signed)
Left message for patient to call back and schedule Medicare Annual Wellness Visit (AWV) in office.  ° °If not able to come in office, please offer to do virtually or by telephone.  Left office number and my jabber #[phone]. ° °Due for AWVI ° °Please schedule at anytime with Nurse Health Advisor. °  °

## 2021-08-10 ENCOUNTER — Telehealth: Payer: Self-pay

## 2021-08-10 NOTE — Telephone Encounter (Signed)
Lft VM to rtn call to schedule AWV (telehealth).  Dm/cma

## 2022-02-01 DIAGNOSIS — H04123 Dry eye syndrome of bilateral lacrimal glands: Secondary | ICD-10-CM | POA: Diagnosis not present

## 2022-03-05 DIAGNOSIS — H5203 Hypermetropia, bilateral: Secondary | ICD-10-CM | POA: Diagnosis not present

## 2022-03-05 DIAGNOSIS — Z0100 Encounter for examination of eyes and vision without abnormal findings: Secondary | ICD-10-CM | POA: Diagnosis not present

## 2022-05-07 ENCOUNTER — Encounter: Payer: Self-pay | Admitting: *Deleted

## 2023-01-07 DIAGNOSIS — L821 Other seborrheic keratosis: Secondary | ICD-10-CM | POA: Diagnosis not present

## 2023-01-07 DIAGNOSIS — D225 Melanocytic nevi of trunk: Secondary | ICD-10-CM | POA: Diagnosis not present

## 2023-07-02 ENCOUNTER — Telehealth: Payer: Self-pay | Admitting: Family Medicine

## 2023-07-02 NOTE — Telephone Encounter (Signed)
 Dear Eura Higashi [facility],  After careful review, it is determined you will need to schedule an appointment. You are able to schedule via MyChart or call [phone] at your convenience. We look forward to seeing you soon!  Via MyChart: Log into MyChart > Schedule an Appointment > Schedule with a Specific Provider > Tilmon Font Adolm Ahumada, MD   Best Regards,   [facility]

## 2023-08-30 DIAGNOSIS — H5203 Hypermetropia, bilateral: Secondary | ICD-10-CM | POA: Diagnosis not present

## 2023-11-05 DIAGNOSIS — H18413 Arcus senilis, bilateral: Secondary | ICD-10-CM | POA: Diagnosis not present

## 2023-11-05 DIAGNOSIS — H25013 Cortical age-related cataract, bilateral: Secondary | ICD-10-CM | POA: Diagnosis not present

## 2023-11-05 DIAGNOSIS — H25043 Posterior subcapsular polar age-related cataract, bilateral: Secondary | ICD-10-CM | POA: Diagnosis not present

## 2023-11-05 DIAGNOSIS — H2511 Age-related nuclear cataract, right eye: Secondary | ICD-10-CM | POA: Diagnosis not present

## 2023-11-05 DIAGNOSIS — H2513 Age-related nuclear cataract, bilateral: Secondary | ICD-10-CM | POA: Diagnosis not present

## 2023-11-29 DIAGNOSIS — H2512 Age-related nuclear cataract, left eye: Secondary | ICD-10-CM | POA: Diagnosis not present

## 2023-11-29 DIAGNOSIS — H2511 Age-related nuclear cataract, right eye: Secondary | ICD-10-CM | POA: Diagnosis not present

## 2023-11-29 DIAGNOSIS — H5371 Glare sensitivity: Secondary | ICD-10-CM | POA: Diagnosis not present

## 2023-12-27 DIAGNOSIS — H25042 Posterior subcapsular polar age-related cataract, left eye: Secondary | ICD-10-CM | POA: Diagnosis not present

## 2023-12-27 DIAGNOSIS — H25012 Cortical age-related cataract, left eye: Secondary | ICD-10-CM | POA: Diagnosis not present

## 2023-12-27 DIAGNOSIS — H2512 Age-related nuclear cataract, left eye: Secondary | ICD-10-CM | POA: Diagnosis not present

## 2023-12-27 DIAGNOSIS — H5371 Glare sensitivity: Secondary | ICD-10-CM | POA: Diagnosis not present
